# Patient Record
Sex: Male | Born: 1965 | Race: White | Hispanic: No | Marital: Married | Smoking: Never smoker
Health system: Southern US, Community
[De-identification: ages and names within clinical notes are randomized; demographics above are authoritative.]

## PROBLEM LIST (undated history)

## (undated) DIAGNOSIS — K8051 Calculus of bile duct without cholangitis or cholecystitis with obstruction: Principal | ICD-10-CM

## (undated) HISTORY — PX: VASECTOMY: SHX75

## (undated) HISTORY — DX: Calculus of bile duct without cholangitis or cholecystitis with obstruction: K80.51

---

## 2010-12-18 LAB — CBC AND DIFFERENTIAL
Hemoglobin: 14.9 g/dL (ref 13.5–17.5)
Platelets: 211 10*3/uL (ref 150–399)

## 2012-06-01 HISTORY — PX: GALLBLADDER SURGERY: SHX652

## 2013-03-21 ENCOUNTER — Encounter: Payer: Self-pay | Admitting: Family Medicine

## 2013-03-21 ENCOUNTER — Ambulatory Visit (INDEPENDENT_AMBULATORY_CARE_PROVIDER_SITE_OTHER): Payer: BC Managed Care – PPO | Admitting: Family Medicine

## 2013-03-21 VITALS — BP 111/74 | HR 65 | Ht 73.0 in | Wt 184.0 lb

## 2013-03-21 DIAGNOSIS — K8051 Calculus of bile duct without cholangitis or cholecystitis with obstruction: Secondary | ICD-10-CM

## 2013-03-21 DIAGNOSIS — Z9889 Other specified postprocedural states: Secondary | ICD-10-CM

## 2013-03-21 DIAGNOSIS — Z13220 Encounter for screening for lipoid disorders: Secondary | ICD-10-CM

## 2013-03-21 DIAGNOSIS — Z131 Encounter for screening for diabetes mellitus: Secondary | ICD-10-CM

## 2013-03-21 HISTORY — DX: Calculus of bile duct without cholangitis or cholecystitis with obstruction: K80.51

## 2013-03-21 NOTE — Progress Notes (Signed)
CC: Rodney Wright is a 47 y.o. male is here for Establish Care   Subjective: HPI:  Very pleasant 47 year old here to establish care  Patient has no acute complaints but informs me he had 90% of his pancreas removed in his gallbladder removed after a five-week stay in the hospital for what I presume is choledocholithiasis causing obstruction.  He reports he was cleared from his surgeon months ago and would like to establish care with out complaints. Over the past 3 months he is been able to gain weight and is back to eating a varied diet without restrictions. He occasionally has diarrhea but 99% of the time stools are daily well formed and painless. He denies any nausea vomiting or GI disturbance over the past 3 months.  No family history of pancreatic cancer. He denies polyuria polyphagia polydipsia nor vision loss. Denies any recent jaundice nor right upper quadrant pain.  Review of Systems - General ROS: negative for - chills, fever, night sweats, weight gain or weight loss Ophthalmic ROS: negative for - decreased vision Psychological ROS: negative for - anxiety or depression ENT ROS: negative for - hearing change, nasal congestion, tinnitus or allergies Hematological and Lymphatic ROS: negative for - bleeding problems, bruising or swollen lymph nodes Breast ROS: negative Respiratory ROS: no cough, shortness of breath, or wheezing Cardiovascular ROS: no chest pain or dyspnea on exertion Gastrointestinal ROS: no abdominal pain, change in bowel habits, or black or bloody stools Genito-Urinary ROS: negative for - genital discharge, genital ulcers, incontinence or abnormal bleeding from genitals Musculoskeletal ROS: negative for - joint pain or muscle pain Neurological ROS: negative for - headaches or memory loss Dermatological ROS: negative for lumps, mole changes, rash and skin lesion changes  Past Medical History  Diagnosis Date  . Choledocholithiasis with obstruction 03/21/2013    December  2013: 90% resection of pancreas.      History reviewed. No pertinent family history.   History  Substance Use Topics  . Smoking status: Never Smoker   . Smokeless tobacco: Not on file  . Alcohol Use: No     Objective: Filed Vitals:   03/21/13 1026  BP: 111/74  Pulse: 65    General: Alert and Oriented, No Acute Distress HEENT: Pupils equal, round, reactive to light. Conjunctivae clear.  External ears unremarkable, canals clear with intact TMs with appropriate landmarks.  Middle ear appears open without effusion. Pink inferior turbinates.  Moist mucous membranes, pharynx without inflammation nor lesions.  Neck supple without palpable lymphadenopathy nor abnormal masses. Lungs: Clear to auscultation bilaterally, no wheezing/ronchi/rales.  Comfortable work of breathing. Good air movement. Cardiac: Regular rate and rhythm. Normal S1/S2.  No murmurs, rubs, nor gallops.   Abdomen: Normal bowel sounds, soft and non tender without palpable masses. Extremities: No peripheral edema.  Strong peripheral pulses.  Mental Status: No depression, anxiety, nor agitation. Skin: Warm and dry.  Assessment & Plan: Rodney Wright was seen today for establish care.  Diagnoses and associated orders for this visit:  Choledocholithiasis with obstruction - Lipid panel - COMPLETE METABOLIC PANEL WITH GFR - Lipase  Lipid screening - Lipid panel  History of pancreatic surgery - COMPLETE METABOLIC PANEL WITH GFR - Lipase  Diabetes mellitus screening - COMPLETE METABOLIC PANEL WITH GFR     choledocholithiasis with obstruction: Stable and controlled without signs of pancreatic insufficiency. We'll obtain baseline fasting sugar also to rule out type 2 diabetes, complete metabolic panel to assess for liver function and bilirubin. Obtaining baseline lipase to ensure no subclinical  pancreatitis. If all labs are normal he may consider return on an annual basis sooner if needed  30 minutes spent face-to-face  during visit today of which at least 50% was counseling or coordinating care regarding choledocholithiasis with obstruction.   Return in about 1 year (around 03/21/2014).

## 2013-03-29 LAB — LIPID PANEL
HDL: 41 mg/dL (ref >39)
Total CHOL/HDL Ratio: 3.5 Ratio
Triglycerides: 91 mg/dL (ref <150)

## 2013-03-29 LAB — COMPLETE METABOLIC PANEL WITH GFR
Alkaline Phosphatase: 74 U/L (ref 39–117)
BUN: 21 mg/dL (ref 6–23)
CO2: 28 mEq/L (ref 19–32)
Creat: 0.89 mg/dL (ref 0.50–1.35)
GFR, Est African American: >89 mL/min
GFR, Est Non African American: >89 mL/min
Glucose, Bld: 90 mg/dL (ref 70–99)
Total Bilirubin: 1 mg/dL (ref 0.3–1.2)

## 2013-03-29 LAB — LIPASE: Lipase: 53 U/L (ref 0–75)

## 2013-04-03 ENCOUNTER — Encounter: Payer: Self-pay | Admitting: Family Medicine

## 2013-04-03 ENCOUNTER — Encounter: Payer: Self-pay | Admitting: *Deleted

## 2013-04-03 DIAGNOSIS — K8591 Acute pancreatitis with uninfected necrosis, unspecified: Secondary | ICD-10-CM | POA: Insufficient documentation

## 2013-04-03 DIAGNOSIS — Z800 Family history of malignant neoplasm of digestive organs: Secondary | ICD-10-CM | POA: Insufficient documentation

## 2013-09-06 ENCOUNTER — Other Ambulatory Visit: Payer: Self-pay

## 2014-12-09 ENCOUNTER — Ambulatory Visit (INDEPENDENT_AMBULATORY_CARE_PROVIDER_SITE_OTHER): Payer: No Typology Code available for payment source | Admitting: Family Medicine

## 2014-12-09 ENCOUNTER — Encounter: Payer: Self-pay | Admitting: Family Medicine

## 2014-12-09 VITALS — BP 142/75 | HR 82 | Wt 213.0 lb

## 2014-12-09 DIAGNOSIS — R112 Nausea with vomiting, unspecified: Secondary | ICD-10-CM

## 2014-12-09 DIAGNOSIS — R413 Other amnesia: Secondary | ICD-10-CM

## 2014-12-09 LAB — COMPLETE METABOLIC PANEL WITH GFR
ALT: 15 U/L (ref 0–53)
AST: 16 U/L (ref 0–37)
Albumin: 3.9 g/dL (ref 3.5–5.2)
Alkaline Phosphatase: 71 U/L (ref 39–117)
BUN: 14 mg/dL (ref 6–23)
CO2: 25 meq/L (ref 19–32)
Calcium: 8.6 mg/dL (ref 8.4–10.5)
Chloride: 104 mEq/L (ref 96–112)
Creat: 1.01 mg/dL (ref 0.50–1.35)
GFR, EST NON AFRICAN AMERICAN: 86 mL/min
GFR, Est African American: >89 mL/min
Glucose, Bld: 128 mg/dL — ABNORMAL HIGH (ref 70–99)
Potassium: 4 mEq/L (ref 3.5–5.3)
SODIUM: 138 meq/L (ref 135–145)
Total Bilirubin: 0.9 mg/dL (ref 0.2–1.2)
Total Protein: 6.6 g/dL (ref 6.0–8.3)

## 2014-12-09 LAB — CBC
HEMATOCRIT: 41.2 % (ref 39.0–52.0)
HEMOGLOBIN: 13.8 g/dL (ref 13.0–17.0)
MCH: 27.5 pg (ref 26.0–34.0)
MCHC: 33.5 g/dL (ref 30.0–36.0)
MCV: 82.2 fL (ref 78.0–100.0)
MPV: 9.3 fL (ref 8.6–12.4)
PLATELETS: 181 10*3/uL (ref 150–400)
RBC: 5.01 MIL/uL (ref 4.22–5.81)
RDW: 13.8 % (ref 11.5–15.5)
WBC: 6.9 10*3/uL (ref 4.0–10.5)

## 2014-12-09 LAB — LIPASE: LIPASE: 1694 U/L — AB (ref 0–75)

## 2014-12-09 NOTE — Progress Notes (Addendum)
CC: Rodney EwingsDaniel Wright is a 49 y.o. male is here for nausea x 1 week   Subjective: HPI:  Complains of one week of nausea that only occurs from 10 PM-1 AM in the morning. For 7 days this has been occurring and his joined with vomiting, once the vomiting occurs he is completely back to his regular state of health without any nausea. His been getting slightly better since the onset. During the day he feels completely fine. There's been no decreased appetite, change in diet, diarrhea, constipation, fevers chills nor unintentional weight loss. He has had some left upper quadrant pain described as a sharpness that's nonradiating that can occur with any activity that occurs maybe once or twice a month. This is been present ever since his pancreas and gallbladder was removed.  He denies any other abdominal pain or back pain. Review of systems is positive for nonproductive cough.symptoms above are currently mild in severity. Nothing particularly makes it better or worse.   3 years ago he's felt like he can't process information is fast. He is unable to provide any details beyond this. His wife has noticed it as well. He denies difficulty with short-term or long-term memory. Denies getting lost in familiar areas.  Review Of Systems Outlined In HPI  Past Medical History  Diagnosis Date  . Choledocholithiasis with obstruction 03/21/2013    December 2013: 90% resection of pancreas.     Past Surgical History  Procedure Laterality Date  . Gallbladder surgery  06/2012  . Vasectomy     Family History  Problem Relation Age of Onset  . Colon cancer Father [id number]    1988  . Hypertension Father   . Hyperlipidemia Father   . Lung cancer Paternal Grandmother     History   Social History  . Marital Status: Married    Spouse Name: N/A    Number of Children: N/A  . Years of Education: N/A   Occupational History  . Not on file.   Social History Main Topics  . Smoking status: Never Smoker   . Smokeless tobacco:  Not on file  . Alcohol Use: No  . Drug Use: No  . Sexual Activity: Yes   Other Topics Concern  . Not on file   Social History Narrative     Objective: BP 142/75 mmHg  Pulse 82  Wt 213 lb (96.616 kg)  General: Alert and Oriented, No Acute Distress HEENT: Pupils equal, round, reactive to light. Conjunctivae clear.  External ears unremarkable, canals clear with intact TMs with appropriate landmarks.  Middle ear appears open without effusion. Pink inferior turbinates.  Moist mucous membranes, pharynx without inflammation nor lesions.  Neck supple without palpable lymphadenopathy nor abnormal masses. Lungs: Clear to auscultation bilaterally, no wheezing/ronchi/rales.  Comfortable work of breathing. Good air movement. Cardiac: Regular rate and rhythm. Normal S1/S2.  No murmurs, rubs, nor gallops.   Abdomen: Normal bowel sounds, soft and non tender without palpable masses. Bowel sounds heard underneath the sternum. Extremities: No peripheral edema.  Strong peripheral pulses.  Mental Status: No depression, anxiety, nor agitation. Skin: Warm and dry.  Assessment & Plan: Rodney Wright was seen today for nausea x 1 week.  Diagnoses and associated orders for this visit:  Non-intractable vomiting with nausea, vomiting of unspecified type - Lipase - COMPLETE METABOLIC PANEL WITH GFR - CBC  Memory loss    Nausea and vomiting: Ruling out pancreatitis, hepatitis and screening for bacterial infection with white count. If the above is normal I prepared  him that his symptoms could also be attributed to viral gastroenteritis and/or hiatal hernia. He denies nausea medication today. Memory loss: Mini-Mental state exam was performed today he scored 30 out of 30, reassurance was provided that his subjective memory loss was not significant enough to warrant further testing nor is it worrisome for progressive cognitive decline.   Return if symptoms worsen or fail to improve.

## 2014-12-10 ENCOUNTER — Telehealth: Payer: Self-pay | Admitting: Family Medicine

## 2014-12-10 DIAGNOSIS — K8500 Idiopathic acute pancreatitis without necrosis or infection: Secondary | ICD-10-CM

## 2014-12-10 NOTE — Telephone Encounter (Signed)
Sue Lushndrea, Will you please let patient know that his lipase level was significantly elevated reflecting pancreatic inflammation.  I would strongly recommend that he meet with a gastroenterologist for further workup and management, I've placed a referral for this today, I'll ask that it be with [facility] since he's been seen by them before.  Fortunately liver function was normal.

## 2014-12-10 NOTE — Telephone Encounter (Signed)
Left message on home # to call back in re labs, ( voice on home # was an unidentified # so I didn't leave a message) message left on cell # with results

## 2014-12-12 ENCOUNTER — Encounter: Payer: Self-pay | Admitting: Family Medicine

## 2014-12-12 DIAGNOSIS — R748 Abnormal levels of other serum enzymes: Secondary | ICD-10-CM | POA: Insufficient documentation

## 2014-12-13 LAB — LIPASE: Lipase: 383

## 2014-12-31 ENCOUNTER — Telehealth: Payer: Self-pay

## 2014-12-31 DIAGNOSIS — R509 Fever, unspecified: Secondary | ICD-10-CM

## 2014-12-31 MED ORDER — OSELTAMIVIR PHOSPHATE 75 MG PO CAPS: 75.0000 mg | ORAL_CAPSULE | Freq: Two times a day (BID) | ORAL | Status: DC

## 2014-12-31 NOTE — Telephone Encounter (Signed)
tamiflu has been sent to his [facility], start immediately and come in during my next availablilty to make sure it's nothing more serious than the flu

## 2014-12-31 NOTE — Telephone Encounter (Signed)
Left message on vm

## 2014-12-31 NOTE — Telephone Encounter (Signed)
Patient's wife called and states; He has been sick for the last 30 hours with fever, chills, headache and body aches. His fever has been up to 102.7. He has taken DayQuil with a little relief. She is concerned he may have the flu. No appointment available today.

## 2015-01-01 ENCOUNTER — Encounter: Payer: Self-pay | Admitting: Family Medicine

## 2015-01-01 ENCOUNTER — Ambulatory Visit (INDEPENDENT_AMBULATORY_CARE_PROVIDER_SITE_OTHER): Payer: No Typology Code available for payment source | Admitting: Family Medicine

## 2015-01-01 VITALS — BP 135/72 | HR 91 | Temp 98.1°F | Wt 206.0 lb

## 2015-01-01 DIAGNOSIS — J111 Influenza due to unidentified influenza virus with other respiratory manifestations: Secondary | ICD-10-CM

## 2015-01-01 NOTE — Progress Notes (Signed)
CC: Rodney EwingsDaniel Wright is a 49 y.o. male is here for flu?   Subjective: HPI:  Complaints of fever maximum temperature 102.7, chills, nausea and 2 episodes of vomiting since Sunday of this last weekend. Symptoms came on abruptly and seemed to have peaked as of yesterday. He was able to tolerate a smoothie yesterday after he got his appetite back. He has not had a temperature today but still takes acetaminophen every 6 hours. He reports fatigue was extreme up to yesterday and today has been more tolerable only moderate in severity. He's had some generalized abdominal discomfort but it is mostly absent today. Symptoms described above were not at all amplified after consuming his smoothie or while drinking Gatorade.  Other than above nothing has influenced his symptoms. He reports that urine is still pale yellow and he continues to have regular bowel movements without diarrhea. No back pain chest pain shortness of breath confusion, sore throat, rash, photophobia nor confusion. He had a headache but he describes it as dull and annoying but even this is improving.   Review Of Systems Outlined In HPI  Past Medical History  Diagnosis Date  . Choledocholithiasis with obstruction 03/21/2013    December 2013: 90% resection of pancreas.     Past Surgical History  Procedure Laterality Date  . Gallbladder surgery  06/2012  . Vasectomy     Family History  Problem Relation Age of Onset  . Colon cancer Father [id number]    1988  . Hypertension Father   . Hyperlipidemia Father   . Lung cancer Paternal Grandmother     History   Social History  . Marital Status: Married    Spouse Name: N/A  . Number of Children: N/A  . Years of Education: N/A   Occupational History  . Not on file.   Social History Main Topics  . Smoking status: Never Smoker   . Smokeless tobacco: Not on file  . Alcohol Use: No  . Drug Use: No  . Sexual Activity: Yes   Other Topics Concern  . Not on file   Social History Narrative      Objective: BP 135/72 mmHg  Pulse 91  Temp(Src) 98.1 F (36.7 C) (Oral)  Wt 206 lb (93.441 kg)  SpO2 96%  General: Alert and Oriented, No Acute Distress but appears mildly fatigued HEENT: Pupils equal, round, reactive to light. Conjunctivae clear.  External ears unremarkable, canals clear with intact TMs with appropriate landmarks.  Middle ear appears open without effusion. Pink inferior turbinates.  Moist mucous membranes, pharynx without inflammation nor lesions.  Neck supple without palpable lymphadenopathy nor abnormal masses. Lungs: Clear to auscultation bilaterally, no wheezing/ronchi/rales.  Comfortable work of breathing. Good air movement. Cardiac: Regular rate and rhythm. Normal S1/S2.  No murmurs, rubs, nor gallops.   Abdomen: Normal bowel sounds, soft and non tender without palpable masses. Extremities: No peripheral edema.  Strong peripheral pulses.  Mental Status: No depression, anxiety, nor agitation. Skin: Warm and dry.  Assessment & Plan: Rodney Wright was seen today for flu?.  Diagnoses and all orders for this visit:  Influenza   Clinical suspicion for influenza-like illnesses extremely high. Given his history of necrotizing pancreatitis he and his wife are understandably concerned that this could be another episode of pancreatitis. Joint decision to watch and wait his condition given improving symptoms and not many symptoms that are specific to pancreatitis or necrotizing pancreatitis. Offered blood work today but joint decision that this is not necessary. He'll continue to keep focused  on trying to stay well-hydrated and use acetaminophen to stay comfortable. Lots of rest. He declines any anti-emetics. Should he have any worsening symptoms have asked him to call me immediately so we can order a CT scan of the abdomen and pelvis with contrast.  25 minutes spent face-to-face during visit today of which at least 50% was counseling or coordinating care regarding: 1. Influenza        Return if symptoms worsen or fail to improve.

## 2015-01-06 ENCOUNTER — Ambulatory Visit (INDEPENDENT_AMBULATORY_CARE_PROVIDER_SITE_OTHER): Payer: No Typology Code available for payment source

## 2015-01-06 ENCOUNTER — Telehealth: Payer: Self-pay | Admitting: Family Medicine

## 2015-01-06 ENCOUNTER — Telehealth: Payer: Self-pay

## 2015-01-06 ENCOUNTER — Encounter: Payer: Self-pay | Admitting: Family Medicine

## 2015-01-06 DIAGNOSIS — R5081 Fever presenting with conditions classified elsewhere: Secondary | ICD-10-CM

## 2015-01-06 DIAGNOSIS — R1909 Other intra-abdominal and pelvic swelling, mass and lump: Secondary | ICD-10-CM

## 2015-01-06 DIAGNOSIS — J9811 Atelectasis: Secondary | ICD-10-CM

## 2015-01-06 DIAGNOSIS — K863 Pseudocyst of pancreas: Secondary | ICD-10-CM | POA: Insufficient documentation

## 2015-01-06 DIAGNOSIS — J90 Pleural effusion, not elsewhere classified: Secondary | ICD-10-CM

## 2015-01-06 DIAGNOSIS — R1010 Upper abdominal pain, unspecified: Secondary | ICD-10-CM

## 2015-01-06 LAB — CBC WITH DIFFERENTIAL/PLATELET
Basophils Absolute: 0 10*3/uL (ref 0.0–0.1)
Basophils Relative: 0 % (ref 0–1)
EOS ABS: 0 10*3/uL (ref 0.0–0.7)
EOS PCT: 0 % (ref 0–5)
HCT: 37.7 % — ABNORMAL LOW (ref 39.0–52.0)
HEMOGLOBIN: 12.1 g/dL — AB (ref 13.0–17.0)
LYMPHS ABS: 0.5 10*3/uL — AB (ref 0.7–4.0)
Lymphocytes Relative: 4 % — ABNORMAL LOW (ref 12–46)
MCH: 25.4 pg — ABNORMAL LOW (ref 26.0–34.0)
MCHC: 32.2 g/dL (ref 30.0–36.0)
MCV: 79.2 fL (ref 78.0–100.0)
Monocytes Absolute: 1.4 10*3/uL — ABNORMAL HIGH (ref 0.1–1.0)
Monocytes Relative: 11 % (ref 3–12)
NEUTROS PCT: 85 % — AB (ref 43–77)
Neutro Abs: 10.7 10*3/uL — ABNORMAL HIGH (ref 1.7–7.7)
Platelets: 307 10*3/uL (ref 150–400)
RBC: 4.76 MIL/uL (ref 4.22–5.81)
RDW: 15.1 % (ref 11.5–15.5)
WBC: 12.6 10*3/uL — ABNORMAL HIGH (ref 4.0–10.5)

## 2015-01-06 LAB — COMPLETE METABOLIC PANEL WITH GFR
ALBUMIN: 3.4 g/dL — AB (ref 3.5–5.2)
ALT: 44 U/L (ref 0–53)
AST: 57 U/L — ABNORMAL HIGH (ref 0–37)
Alkaline Phosphatase: 94 U/L (ref 39–117)
BUN: 12 mg/dL (ref 6–23)
CALCIUM: 8.3 mg/dL — AB (ref 8.4–10.5)
CO2: 27 meq/L (ref 19–32)
CREATININE: 0.94 mg/dL (ref 0.50–1.35)
Chloride: 90 mEq/L — ABNORMAL LOW (ref 96–112)
GFR, Est Non African American: >89 mL/min
GLUCOSE: 150 mg/dL — AB (ref 70–99)
Potassium: 3.2 mEq/L — ABNORMAL LOW (ref 3.5–5.3)
SODIUM: 134 meq/L — AB (ref 135–145)
TOTAL PROTEIN: 6.5 g/dL (ref 6.0–8.3)
Total Bilirubin: 0.6 mg/dL (ref 0.2–1.2)

## 2015-01-06 LAB — LIPASE: LIPASE: 101 U/L — AB (ref 0–75)

## 2015-01-06 MED ORDER — CIPROFLOXACIN HCL 500 MG PO TABS: 500.0000 mg | ORAL_TABLET | Freq: Two times a day (BID) | ORAL | Status: DC

## 2015-01-06 MED ORDER — METRONIDAZOLE 500 MG PO TABS: 500.0000 mg | ORAL_TABLET | Freq: Three times a day (TID) | ORAL | Status: DC

## 2015-01-06 MED ORDER — IOHEXOL 300 MG/ML  SOLN
100.0000 mL | Freq: Once | INTRAMUSCULAR | Status: AC | PRN
  Administered 2015-01-06: 100 mL via INTRAVENOUS

## 2015-01-06 NOTE — Telephone Encounter (Signed)
No PA required for CT abdomen and pelvis with contrast per [facility] notified.

## 2015-01-06 NOTE — Telephone Encounter (Signed)
Sue Lushndrea, Will you please let patient know that his CT scan showed that he's growing a cyst around the pancreas.  This could be representative of an infection that's causing his discomfort and what I thought was the flu last week.  His blood work is not back yet but I'd recommend he see a Development worker, international aidgeneral surgeon for their opinion.  I'll place a urgent referral now.  Also I'd recommend he start taking ciprofloxacin and metronidazole antibiotics that [facility]. I would also ask him to notify Dr. Caryl NeverJue (his gastroenterologist) about this ASAP.

## 2015-01-06 NOTE — Telephone Encounter (Signed)
Will you please let patient know that the general surgery office we tried to refer him to said they had nothing to offer him.  I suspect this cyst needs to be drained and I'm surprised they weren't willing to help.  I'd strongly recommend he see Dr. Caryl NeverJue or one of his colleagues ASAP. Since he's already established with them all it probably requires is for him to all their office, I'm sure one of our referral clerks would be able to help out if he's having difficulty with access.  We should have labs back tomorrow.

## 2015-01-06 NOTE — Telephone Encounter (Signed)
General surgeon office said they did not have any urgent appt for this pt because a cyst in the pancrease would not be taken care of in the office and is not considered urgent. She states if he is having fever and chills, ect then she said he should go to the ED.  Pt was notified about your reccomendations

## 2015-01-06 NOTE — Telephone Encounter (Signed)
Left message on vm

## 2015-01-06 NOTE — Telephone Encounter (Signed)
Rodney Wright complains he still has fever, chills and abdominal pain off and on. Dr Hommel's recommendation was to get a CT abdomen and pelvis with contrast and lab work - CBC diff, CMP Lipase. Patient is aware and is on his way. Labs have been faxed to the lab for stat blood draw.

## 2015-01-07 ENCOUNTER — Telehealth: Payer: Self-pay | Admitting: Family Medicine

## 2015-01-07 NOTE — Telephone Encounter (Signed)
Called patient (both numbers)  to get updates on his condition and go over lab work.  No answer, left message, will continue attempts to contact him.

## 2015-01-07 NOTE — Telephone Encounter (Signed)
Again, left message on cell phone.

## 2015-01-07 NOTE — Telephone Encounter (Signed)
Attempted cell phone, no answer.

## 2015-01-07 NOTE — Telephone Encounter (Signed)
Contacted patient, fever free for 24 hours now after starting antibiotics, met with his old surgeon today and will have drain placed tomorrow as an outpatient.  Reviewed labs, very little caloric intake over the last week, discussed foods/soups to help replete electrolytes. 

## 2015-01-14 ENCOUNTER — Encounter: Payer: Self-pay | Admitting: Family Medicine

## 2015-01-20 ENCOUNTER — Telehealth: Payer: Self-pay | Admitting: Family Medicine

## 2015-01-20 ENCOUNTER — Other Ambulatory Visit: Payer: Self-pay | Admitting: Surgery

## 2015-01-20 DIAGNOSIS — K863 Pseudocyst of pancreas: Secondary | ICD-10-CM

## 2015-01-20 NOTE — Telephone Encounter (Signed)
Patient did not show up for appt at [facility] today.  They wanted to make you aware.

## 2015-01-22 ENCOUNTER — Ambulatory Visit (INDEPENDENT_AMBULATORY_CARE_PROVIDER_SITE_OTHER): Payer: No Typology Code available for payment source

## 2015-01-22 DIAGNOSIS — K863 Pseudocyst of pancreas: Secondary | ICD-10-CM | POA: Diagnosis not present

## 2015-01-22 MED ORDER — IOHEXOL 300 MG/ML  SOLN: 100.0000 mL | Freq: Once | INTRAMUSCULAR | Status: AC | PRN

## 2015-09-10 ENCOUNTER — Encounter: Payer: Self-pay | Admitting: Family Medicine

## 2015-09-10 ENCOUNTER — Ambulatory Visit (INDEPENDENT_AMBULATORY_CARE_PROVIDER_SITE_OTHER): Payer: 59 | Admitting: Family Medicine

## 2015-09-10 ENCOUNTER — Ambulatory Visit (INDEPENDENT_AMBULATORY_CARE_PROVIDER_SITE_OTHER): Payer: 59

## 2015-09-10 VITALS — BP 125/73 | HR 56 | Wt 217.0 lb

## 2015-09-10 DIAGNOSIS — M7042 Prepatellar bursitis, left knee: Secondary | ICD-10-CM | POA: Diagnosis not present

## 2015-09-10 DIAGNOSIS — Z1389 Encounter for screening for other disorder: Secondary | ICD-10-CM | POA: Diagnosis not present

## 2015-09-10 DIAGNOSIS — M25562 Pain in left knee: Secondary | ICD-10-CM | POA: Diagnosis not present

## 2015-09-10 DIAGNOSIS — G8929 Other chronic pain: Secondary | ICD-10-CM

## 2015-09-10 MED ORDER — DICLOFENAC SODIUM 1 % TD GEL: 4.0000 g | Freq: Four times a day (QID) | TRANSDERMAL | Status: DC

## 2015-09-10 MED ORDER — NAPROXEN 500 MG PO TABS: 500.0000 mg | ORAL_TABLET | Freq: Two times a day (BID) | ORAL | Status: DC

## 2015-09-10 NOTE — Progress Notes (Signed)
Rodney EwingsDaniel Wright is a 49 y.o. male who [facility]  today for left knee pain. Patient was in his normal state of health a week and a half ago. He fell and landed on his left knee in the parking lot. He notes pain in the anterior portion of his knee and on the lateral portion of his knee since. Pain is worse with resisted knee extension. He's tried some over-the-counter medicines helped a bit. No fevers chills nausea vomiting or diarrhea. No locking catching or giving way. He denies any significant swelling.   Past Medical History  Diagnosis Date  . Choledocholithiasis with obstruction 03/21/2013    December 2013: 90% resection of pancreas.    Past Surgical History  Procedure Laterality Date  . Gallbladder surgery  06/2012  . Vasectomy     Social History  Substance Use Topics  . Smoking status: Never Smoker   . Smokeless tobacco: Not on file  . Alcohol Use: No   family history includes Colon cancer (age of onset: 548) in his father; Hyperlipidemia in his father; Hypertension in his father; Lung cancer in his paternal grandmother.  ROS as above Medications: Current Outpatient Prescriptions  Medication Sig Dispense Refill  . diclofenac sodium (VOLTAREN) 1 % GEL Apply 4 g topically 4 (four) times daily. 100 g 12  . naproxen (NAPROSYN) 500 MG tablet Take 1 tablet (500 mg total) by mouth 2 (two) times daily with a meal. 28 tablet 0   No current facility-administered medications for this visit.   No Known Allergies   Exam:  BP 125/73 mmHg  Pulse 56  Wt 217 lb (98.431 kg) Gen: Well NAD  Exts: Brisk capillary refill, warm and well perfused.  Left knee: Slight ecchymosis present just inferior to the patellar tendon insertion onto the tibia. He has a palpable squeak overlying the patellar tendon. The patellar tendon itself is tender to palpation. The lateral joint line is mildly tender to palpation as well. Negative medial and lateral  McMurray's tests. Negative valgus and varus stress. Negative anterior and posterior drawer test. Knee Range of motion is normal. Normal gait   No results found for this or any previous visit (from the past 24 hour(s)). Dg Knee 1-2 Views Right  09/10/2015  CLINICAL DATA:  Chronic left knee pain. No reported injury. Initial evaluation. EXAM: RIGHT KNEE - 1-2 VIEW COMPARISON:  No prior exams available for comparison. FINDINGS: No acute bony or joint abnormality identified. Mild medial compartment degenerative change. IMPRESSION: No acute abnormality.  Mild medial compartment degenerative change. 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 Otherwise the knee x-rays normal without fracture. Awaiting formal radiology read.  Please see individual assessment and plan sections.

## 2015-09-10 NOTE — Progress Notes (Signed)
Quick Note:  X-rays show no fractures. You do have evidence of old Osgood-Schlatter's disease this likely not causing your current pain. ______

## 2015-09-10 NOTE — Assessment & Plan Note (Signed)
Symptoms are most consistent with traumatic prepatellar bursitis of the left knee. He may also have a possible lateral meniscus tear based on joint line tenderness. Plan for relative rest topical diclofenac and oral naproxen. Recheck in a few weeks. I recommended eccentric patellar tendon exercises.

## 2015-09-10 NOTE — Patient Instructions (Signed)
Thank you for coming in today. Get x-rays today. My staff will call with results. Use ice as needed. Apply diclofenac gel and use oral naproxen as needed for pain. Take the pills with Zantac Pepcid or Prilosec to prevent stomach ulceration. Return in a few weeks if not improving.  Prepatellar Bursitis With Rehab  Bursitis is a condition that is characterized by inflammation of a bursa. Kateri Mc exists in many areas of the body. They are fluid-filled sacs that lie between a soft tissue (skin, tendon, or ligament) and a bone, and they reduce friction between the structures as well as the stress placed on the soft tissue. Prepatellar bursitis is inflammation of the bursa that lies between the skin and the kneecap (patella). This condition often causes pain over the patella. SYMPTOMS   Pain, tenderness, and/or inflammation over the patella.  Pain that worsens with movement of the knee joint.  Decreased range of motion for the knee joint.  A crackling sound (crepitation) when the bursa is moved or touched.  Occasionally, painless swelling of the bursa.  Fever (when infected). CAUSES  Bursitis is caused by damage to the bursa, which results in an inflammatory response. Common mechanisms of injury include:  Direct trauma to the front of the knee.  Repetitive and/or stressful use of the knee. RISK INCREASES WITH:  Activities in which kneeling and/or falling on one's knees is likely (volleyball or football).  Repetitive and stressful training, especially if it involves running on hills.  Improper training techniques, such as a sudden increase in the intensity, frequency, or duration of training.  Failure to warm up properly before activity.  Poor technique.  Artificial turf. PREVENTION   Avoid kneeling or falling on your knees.  Warm up and stretch properly before activity.  Allow for adequate recovery between workouts.  Maintain physical fitness:  Strength, flexibility, and  endurance.  Cardiovascular fitness.  Learn and use proper technique. When possible, have a coach correct improper technique.  Wear properly fitted and padded protective equipment (knee pads). PROGNOSIS  If treated properly, then the symptoms of prepatellar bursitis usually resolve within 2 weeks. RELATED COMPLICATIONS   Recurrent symptoms that result in a chronic problem.  Prolonged healing time, if improperly treated or reinjured.  Limited range of motion.  Infection of bursa.  Chronic inflammation or scarring of bursa. TREATMENT  Treatment initially involves the use of ice and medication to help reduce pain and inflammation. The use of strengthening and stretching exercises may help reduce pain with activity, especially those of the quadriceps and hamstring muscles. These exercises may be performed at home or with referral to a therapist. Your caregiver may recommend knee pads when you return to playing sports, in order to reduce the stress on the prepatellar bursa. If symptoms persist despite treatment, then your caregiver may drain fluid out with a needle (aspirate) the bursa. If symptoms persist for greater than 6 months despite nonsurgical (conservative) treatment, then surgery may be recommended to remove the bursa.  MEDICATION  If pain medication is necessary, then nonsteroidal anti-inflammatory medications, such as aspirin and ibuprofen, or other minor pain relievers, such as acetaminophen, are often recommended.  Do not take pain medication for 7 days before surgery.  Prescription pain relievers may be given if deemed necessary by your caregiver. Use only as directed and only as much as you need.  Corticosteroid injections may be given by your caregiver. These injections should be reserved for the most serious cases, because they may only be given  a certain number of times. HEAT AND COLD  Cold treatment (icing) relieves pain and reduces inflammation. Cold treatment should  be applied for 10 to 15 minutes every 2 to 3 hours for inflammation and pain and immediately after any activity that aggravates your symptoms. Use ice packs or massage the area with a piece of ice (ice massage).  Heat treatment may be used prior to performing the stretching and strengthening activities prescribed by your caregiver, physical therapist, or athletic trainer. Use a heat pack or soak the injury in warm water. SEEK MEDICAL CARE IF:  Treatment seems to offer no benefit, or the condition worsens.  Any medications produce adverse side effects. EXERCISES RANGE OF MOTION (ROM) AND STRETCHING EXERCISES - Prepatellar Bursitis These exercises may help you when beginning to rehabilitate your injury. Your symptoms may resolve with or without further involvement from your physician, physical therapist or athletic trainer. While completing these exercises, remember:   Restoring tissue flexibility helps normal motion to return to the joints. This allows healthier, less painful movement and activity.  An effective stretch should be held for at least 30 seconds.  A stretch should never be painful. You should only feel a gentle lengthening or release in the stretched tissue. STRETCH - Hamstrings, Standing  Stand or sit and extend your right / left leg, placing your foot on a chair or foot stool  Keeping a slight arch in your low back and your hips straight forward.  Lead with your chest and lean forward at the waist until you feel a gentle stretch in the back of your right / left knee or thigh. (When done correctly, this exercise requires leaning only a small distance.)  Hold this position for __________ seconds. Repeat __________ times. Complete this stretch __________ times per day. STRETCH - Quadriceps, Prone   Lie on your stomach on a firm surface, such as a bed or padded floor.  Bend your right / left knee and grasp your ankle. If you are unable to reach, your ankle or pant leg, use a  belt around your foot to lengthen your reach.  Gently pull your heel toward your buttocks. Your knee should not slide out to the side. You should feel a stretch in the front of your thigh and/or knee.  Hold this position for __________ seconds. Repeat __________ times. Complete this stretch __________ times per day.  STRETCH - Hamstrings/Adductors, V-Sit   Sit on the floor with your legs extended in a large "V," keeping your knees straight.  With your head and chest upright, bend at your waist reaching for your right foot to stretch your left adductors.  You should feel a stretch in your left inner thigh. Hold for __________ seconds.  Return to the upright position to relax your leg muscles.  Continuing to keep your chest upright, bend straight forward at your waist to stretch your hamstrings.  You should feel a stretch behind both of your thighs and/or knees. Hold for __________ seconds.  Return to the upright position to relax your leg muscles.  Repeat steps 2 through 4. Repeat __________ times. Complete this exercise __________ times per day.  STRENGTHENING EXERCISES - Prepatellar Bursitis  These exercises may help you when beginning to rehabilitate your injury. They may resolve your symptoms with or without further involvement from your physician, physical therapist or athletic trainer. While completing these exercises, remember:  Muscles can gain both the endurance and the strength needed for everyday activities through controlled exercises.  Complete these  exercises as instructed by your physician, physical therapist or athletic trainer. Progress the resistance and repetitions only as guided. STRENGTH - Quadriceps, Isometrics  Lie on your back with your right / left leg extended and your opposite knee bent.  Gradually tense the muscles in the front of your right / left thigh. You should see either your kneecap slide up toward your hip or increased dimpling just above the  knee. This motion will push the back of the knee down toward the floor/mat/bed on which you are lying.  Hold the muscle as tight as you can without increasing your pain for __________ seconds.  Relax the muscles slowly and completely in between each repetition. Repeat __________ times. Complete this exercise __________ times per day.  STRENGTH - Quadriceps, Short Arcs   Lie on your back. Place a __________ inch towel roll under your knee so that the knee slightly bends.  Raise only your lower leg by tightening the muscles in the front of your thigh. Do not allow your thigh to rise.  Hold this position for __________ seconds. Repeat __________ times. Complete this exercise __________ times per day.  OPTIONAL ANKLE WEIGHTS: Begin with ____________________, but DO NOT exceed ____________________. Increase in1 lb/0.5 kg increments.  STRENGTH - Quadriceps, Straight Leg Raises  Quality counts! Watch for signs that the quadriceps muscle is working to insure you are strengthening the correct muscles and not "cheating" by substituting with healthier muscles.  Lay on your back with your right / left leg extended and your opposite knee bent.  Tense the muscles in the front of your right / left thigh. You should see either your kneecap slide up or increased dimpling just above the knee. Your thigh may even quiver.  Tighten these muscles even more and raise your leg 4 to 6 inches off the floor. Hold for __________ seconds.  Keeping these muscles tense, lower your leg.  Relax the muscles slowly and completely in between each repetition. Repeat __________ times. Complete this exercise __________ times per day.  STRENGTH - Quadriceps, Step-Ups   Use a thick book, step or step stool that is __________ inches tall.  Holding a wall or counter for balance only, not support.  Slowly step-up with your right / left foot, keeping your knee in line with your hip and foot. Do not allow your knee to bend so  far that you cannot see your toes.  Slowly unlock your knee and lower yourself to the starting position. Your muscles, not gravity, should lower you. Repeat __________ times. Complete this exercise __________ times per day.   This information is not intended to replace advice given to you by your health care provider. Make sure you discuss any questions you have with your health care provider.   Document Released: 10/18/2005 Document Revised: 07/09/2015 Document Reviewed: 01/30/2009 Elsevier Interactive Patient Education [employer]2016 [employer] Inc.

## 2015-12-17 ENCOUNTER — Encounter: Payer: Self-pay | Admitting: Family Medicine

## 2015-12-17 ENCOUNTER — Ambulatory Visit (INDEPENDENT_AMBULATORY_CARE_PROVIDER_SITE_OTHER): Payer: 59 | Admitting: Family Medicine

## 2015-12-17 VITALS — BP 120/74 | HR 69 | Wt 218.0 lb

## 2015-12-17 DIAGNOSIS — R197 Diarrhea, unspecified: Secondary | ICD-10-CM

## 2015-12-17 MED ORDER — DIPHENOXYLATE-ATROPINE 2.5-0.025 MG PO TABS: 1.0000 | ORAL_TABLET | Freq: Four times a day (QID) | ORAL | Status: DC | PRN

## 2015-12-17 NOTE — Progress Notes (Signed)
CC: Rodney Wright is a 50 y.o. male is here for Diarrhea   Subjective: HPI:  Ever since Monday evening he's been experiencing loose stool described as diarrhea. It's further described as liquid with mushy bits of feces.is having a dedicated to 6 times a day. It's also waking him up early in the morning to have a bowel movement. He denies any bowel discomfort. He decreased his solids intake but only because he notices it more he has diarrhea. He denies any nausea or back pain. He denies any fevers, chills or flank pain. No blood or melena appearance to the stool. He denies any recent travel, consumption of uncooked food, consumption of untreated water, exposure to C. Difficile, or exposure to any other diarrhea patients. He has not been around any healthcare facilities and has not been on a antibiotic in a matter of months. Symptoms are moderate on daily basis with only mild improvement with Imodium AD. Denies shortness of breath, cough or rash   Review Of Systems Outlined In HPI  Past Medical History  Diagnosis Date  . Choledocholithiasis with obstruction 03/21/2013    December 2013: 90% resection of pancreas.     Past Surgical History  Procedure Laterality Date  . Gallbladder surgery  06/2012  . Vasectomy     Family History  Problem Relation Age of Onset  . Colon cancer Father 63    1988  . Hypertension Father   . Hyperlipidemia Father   . Lung cancer Paternal Grandmother     Social History   Social History  . Marital Status: Married    Spouse Name: N/A  . Number of Children: N/A  . Years of Education: N/A   Occupational History  . Not on file.   Social History Main Topics  . Smoking status: Never Smoker   . Smokeless tobacco: Not on file  . Alcohol Use: No  . Drug Use: No  . Sexual Activity: Yes   Other Topics Concern  . Not on file   Social History Narrative     Objective: BP 120/74 mmHg  Pulse 69  Wt 218 lb (98.884 kg)  General: Alert and Oriented, No Acute  Distress HEENT: Pupils equal, round, reactive to light. Conjunctivae clear.  Moistness membranes Lungs: Clear to auscultation bilaterally, no wheezing/ronchi/rales.  Comfortable work of breathing. Good air movement. Cardiac: Regular rate and rhythm. Normal S1/S2.  No murmurs, rubs, nor gallops.   Abdomen: Normal bowel sounds, soft and non tender without palpable masses. No rebound tenderness or guarding Extremities: No peripheral edema.  Strong peripheral pulses.  Mental Status: No depression, anxiety, nor agitation. Skin: Warm and dry.  Assessment & Plan: Rodney Wright was seen today for diarrhea.  Diagnoses and all orders for this visit:  Diarrhea, unspecified type -     COMPLETE METABOLIC PANEL WITH GFR -     CBC with Differential -     Lipase -     Stool culture -     diphenoxylate-atropine (LOMOTIL) 2.5-0.025 MG tablet; Take 1 tablet by mouth 4 (four) times daily as needed for diarrhea or loose stools.   Diarrhea: labs as above, very low suspicion for C. Difficile given lack of risk factors. Checking stool culture and start Lomotil. Still possible that this can be due to Norovirus which should be self resolving. Discussed ways of staying well hydrated.   Return if symptoms worsen or fail to improve.

## 2015-12-18 ENCOUNTER — Telehealth: Payer: Self-pay | Admitting: Family Medicine

## 2015-12-18 DIAGNOSIS — K863 Pseudocyst of pancreas: Secondary | ICD-10-CM

## 2015-12-18 DIAGNOSIS — R197 Diarrhea, unspecified: Secondary | ICD-10-CM

## 2015-12-18 LAB — COMPLETE METABOLIC PANEL WITH GFR
ALBUMIN: 4.3 g/dL (ref 3.6–5.1)
ALK PHOS: 80 U/L (ref 40–115)
ALT: 17 U/L (ref 9–46)
AST: 20 U/L (ref 10–35)
BUN: 22 mg/dL (ref 7–25)
CALCIUM: 8.8 mg/dL (ref 8.6–10.3)
CHLORIDE: 103 mmol/L (ref 98–110)
CO2: 26 mmol/L (ref 20–31)
Creat: 1 mg/dL (ref 0.70–1.33)
GFR, EST NON AFRICAN AMERICAN: 87 mL/min (ref >=60)
GFR, Est African American: >89 mL/min (ref >=60)
GLUCOSE: 84 mg/dL (ref 65–99)
POTASSIUM: 4.6 mmol/L (ref 3.5–5.3)
SODIUM: 137 mmol/L (ref 135–146)
Total Bilirubin: 0.6 mg/dL (ref 0.2–1.2)
Total Protein: 7.1 g/dL (ref 6.1–8.1)

## 2015-12-18 LAB — CBC WITH DIFFERENTIAL/PLATELET
BASOS PCT: 0 % (ref 0–1)
Basophils Absolute: 0 10*3/uL (ref 0.0–0.1)
Eosinophils Absolute: 0.1 10*3/uL (ref 0.0–0.7)
Eosinophils Relative: 1 % (ref 0–5)
HEMATOCRIT: 40.1 % (ref 39.0–52.0)
HEMOGLOBIN: 12.7 g/dL — AB (ref 13.0–17.0)
LYMPHS PCT: 7 % — AB (ref 12–46)
Lymphs Abs: 0.5 10*3/uL — ABNORMAL LOW (ref 0.7–4.0)
MCH: 24.9 pg — AB (ref 26.0–34.0)
MCHC: 31.7 g/dL (ref 30.0–36.0)
MCV: 78.6 fL (ref 78.0–100.0)
MPV: 10 fL (ref 8.6–12.4)
Monocytes Absolute: 0.6 10*3/uL (ref 0.1–1.0)
Monocytes Relative: 8 % (ref 3–12)
NEUTROS ABS: 6.6 10*3/uL (ref 1.7–7.7)
NEUTROS PCT: 84 % — AB (ref 43–77)
Platelets: 134 10*3/uL — ABNORMAL LOW (ref 150–400)
RBC: 5.1 MIL/uL (ref 4.22–5.81)
RDW: 13.8 % (ref 11.5–15.5)
WBC: 7.8 10*3/uL (ref 4.0–10.5)

## 2015-12-18 LAB — LIPASE: Lipase: 20 U/L (ref 7–60)

## 2015-12-18 NOTE — Telephone Encounter (Signed)
Pt.notified

## 2015-12-18 NOTE — Telephone Encounter (Signed)
Will you please let patient know that his Lipase, liver function, and white blood cell count was normal.  As we discussed during yesterdays office visit I'd recommend getting an abdominal ultrasound.  I've put in an order for this, can you please give him the phone number for our radiology office so he can try to schedule this today or tomorrow.

## 2015-12-18 NOTE — Telephone Encounter (Signed)
Awaiting call back.

## 2015-12-22 LAB — STOOL CULTURE

## 2015-12-23 ENCOUNTER — Ambulatory Visit (INDEPENDENT_AMBULATORY_CARE_PROVIDER_SITE_OTHER): Payer: 59

## 2015-12-23 DIAGNOSIS — K7689 Other specified diseases of liver: Secondary | ICD-10-CM

## 2015-12-23 DIAGNOSIS — K863 Pseudocyst of pancreas: Secondary | ICD-10-CM

## 2015-12-23 DIAGNOSIS — R197 Diarrhea, unspecified: Secondary | ICD-10-CM

## 2016-01-01 ENCOUNTER — Telehealth: Payer: Self-pay

## 2016-01-01 NOTE — Telephone Encounter (Signed)
I've run out of tests and I'm at a loss to find the source of his diarrhea. I'd recommend he reach out to [facility] for further evaluation. Please let me know if he needs a referral for some reason, since he already is established with them I don't think a referral is required.  (These are the same recommendations from last month)

## 2016-01-01 NOTE — Telephone Encounter (Signed)
Pt reports that he is still having problem with diarrhea and wants to know what next.

## 2016-01-01 NOTE — Telephone Encounter (Signed)
Pt advised.

## 2016-06-06 IMAGING — CT CT ABD-PELV W/ CM
2 of 5 series · 13 of 32 positions shown, 18 images · IV contrast (omnipaque)
Comparison: None.

CLINICAL DATA: Left-sided abdominal pain for 2 weeks with radiation
into the back, fevers, history of pancreatitis

EXAM:
CT ABDOMEN AND PELVIS WITH CONTRAST
TECHNIQUE: Multidetector CT imaging of the abdomen and pelvis was performed
using the standard protocol following bolus administration of
intravenous contrast.
CONTRAST:  100mL OMNIPAQUE IOHEXOL 300 MG/ML  SOLN

[Series 2: abd/pelvis with · axial · 0.71mm/px · z∈[-481,-136]mm · 6 of 97 slices shown, 11 images]
[im 14/97  soft-tissue]
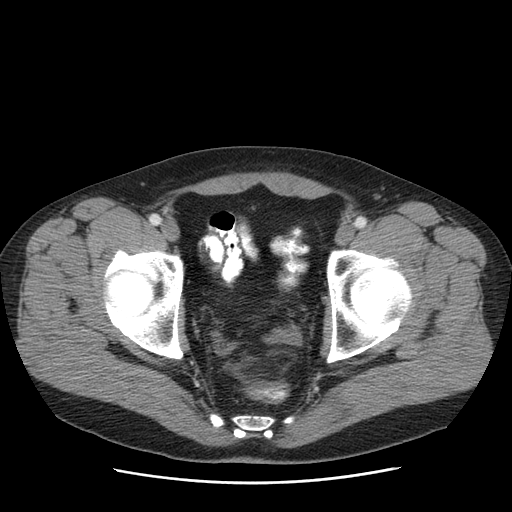
[im 14/97  bone]
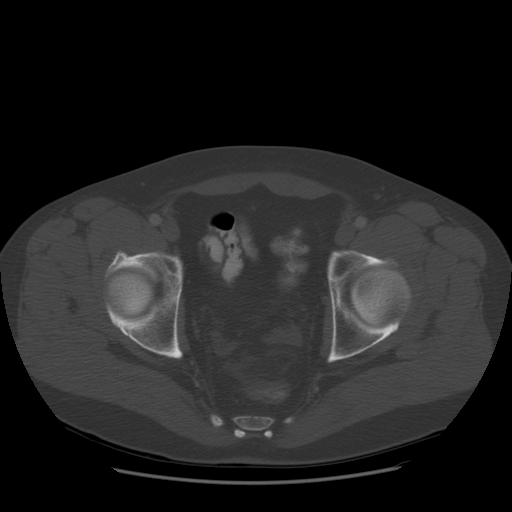
[im 28/97  soft-tissue]
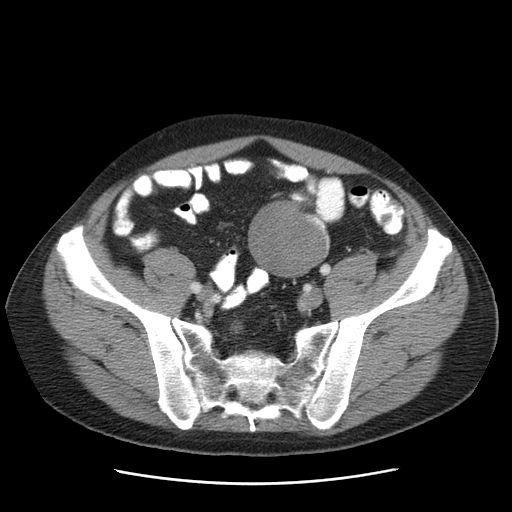
[im 42/97  soft-tissue]
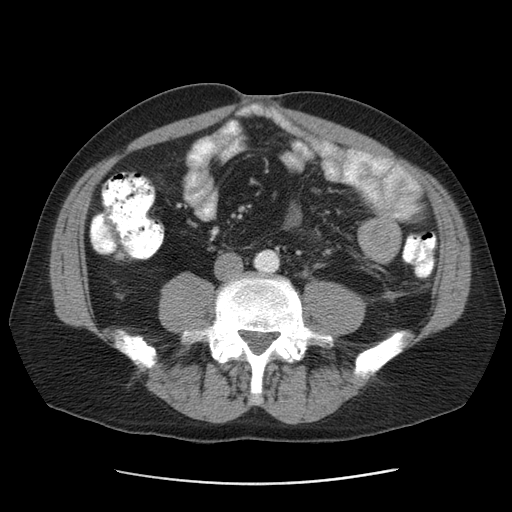
[im 42/97  lung]
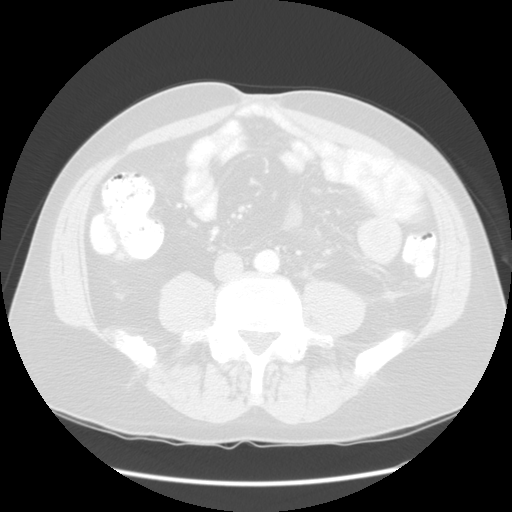
[im 55/97  soft-tissue]
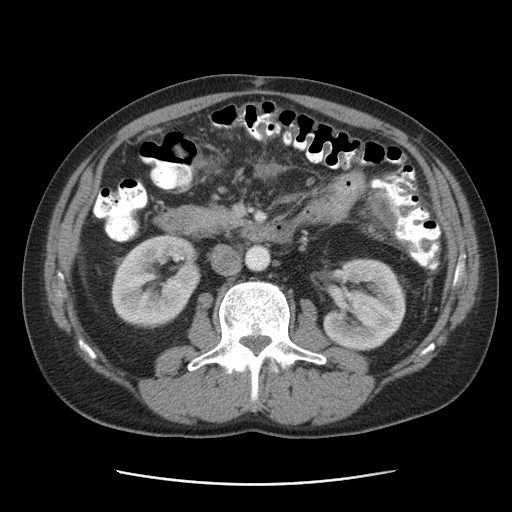
[im 55/97  lung]
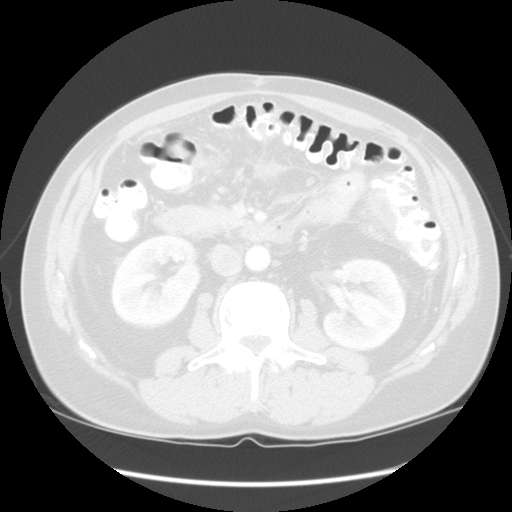
[im 69/97  soft-tissue]
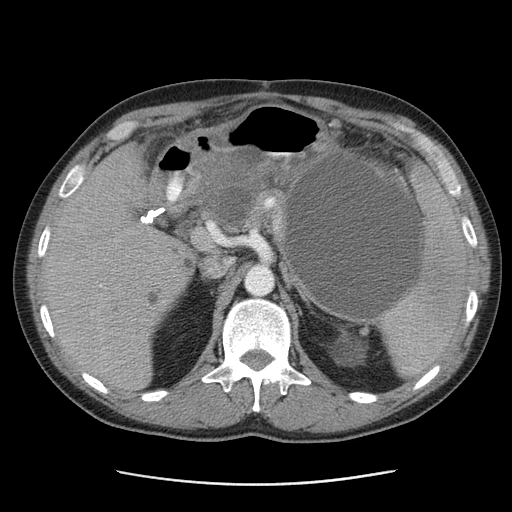
[im 69/97  lung]
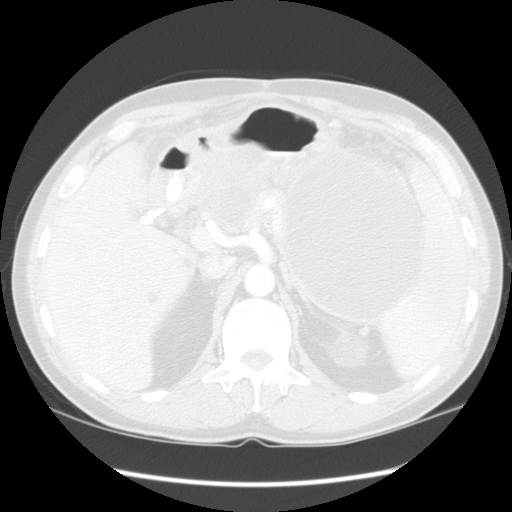
[im 83/97  soft-tissue]
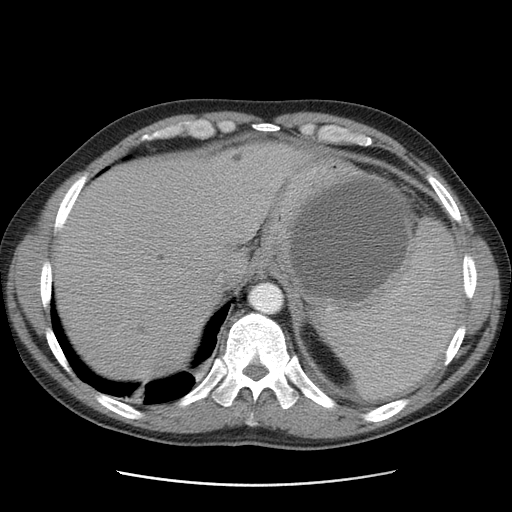
[im 83/97  lung]
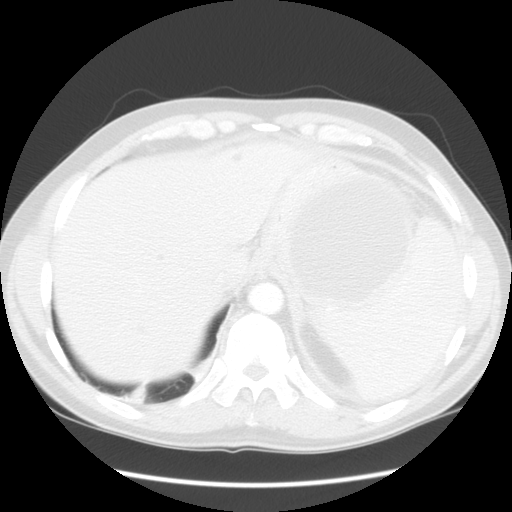

[Series 400: sag soft · sagittal · 0.97mm/px · 7 of 143 slices shown]
[im 15/143  soft-tissue]
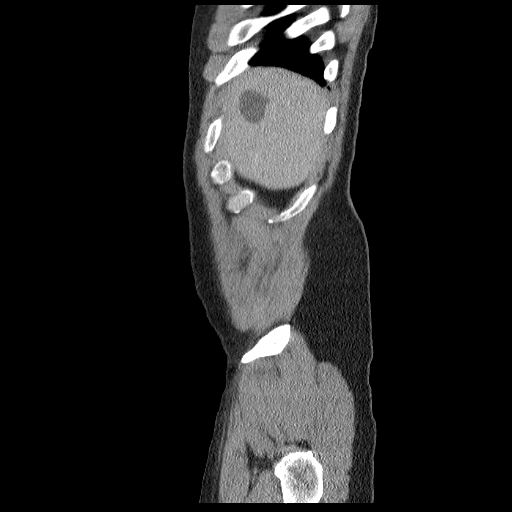
[im 29/143  soft-tissue]
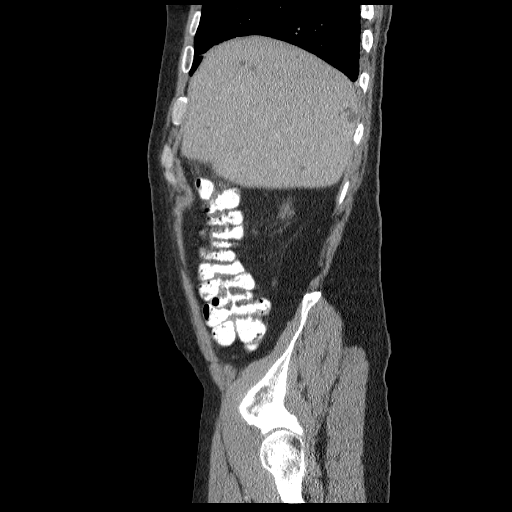
[im 43/143  soft-tissue]
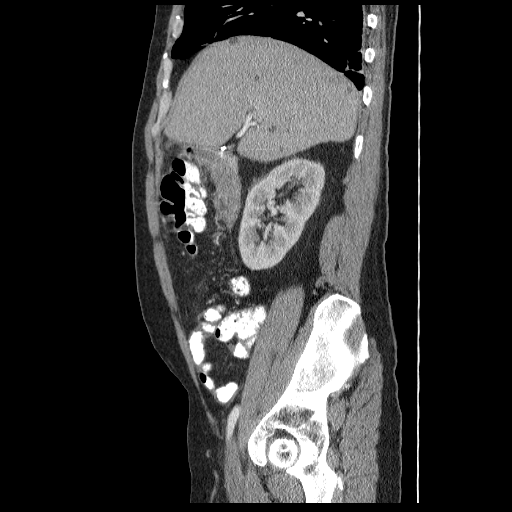
[im 57/143  soft-tissue]
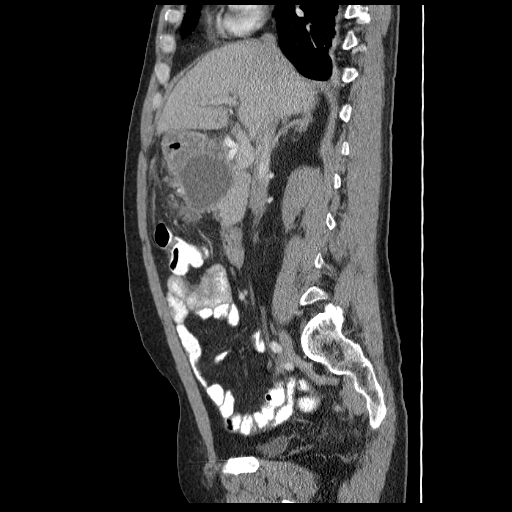
[im 86/143  soft-tissue]
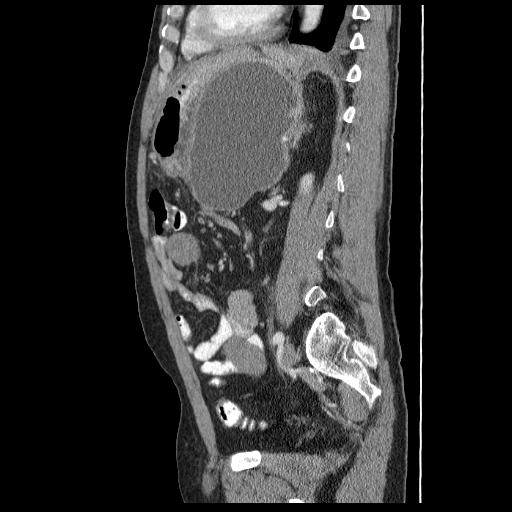
[im 100/143  soft-tissue]
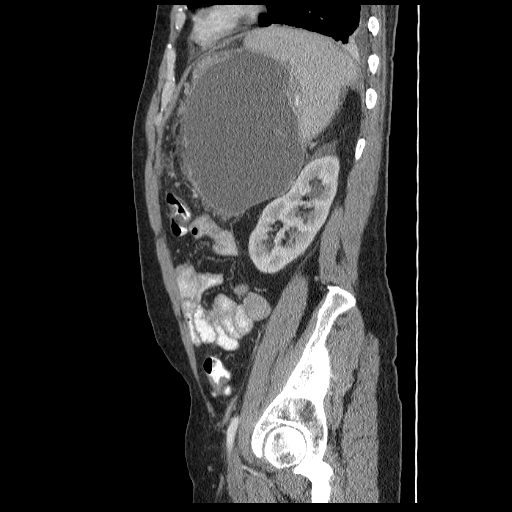
[im 114/143  soft-tissue]
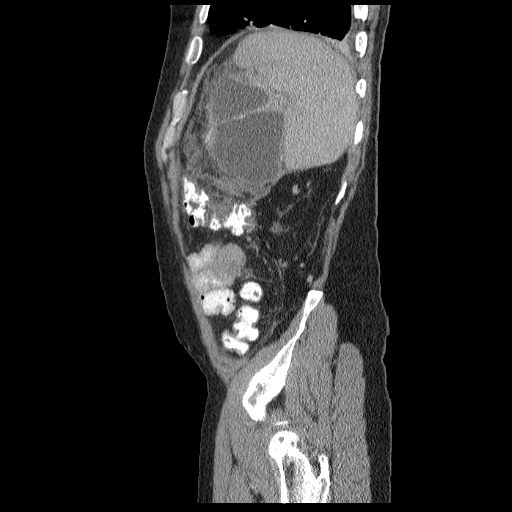

[13 of 32 positions shown; findings below may reference images not displayed]

FINDINGS: Lung bases demonstrate mild atelectatic changes bilaterally slightly
worse on the left than the right with a small left-sided pleural
effusion.

Multiple rounded hypodensities are noted throughout the liver. The
largest of these lies within the right lobe measuring 2.6 cm in
greatest dimension consistent with small cysts. The spleen is within
normal limits. The adrenal glands are unremarkable. The gallbladder
has been surgically removed. In the head and body of the pancreas is
a large bilobed fluid collection identified. It measures at least
17.7 cm in transverse dimension and 10.4 cm in AP dimension. This is
consistent with a large pancreatic pseudocyst. Some surrounding
inflammatory changes are noted suggesting an acute on chronic
process. The pancreatic head and uncinate process appear within
normal limits. No other normal pancreatic tissue is identified. A
minimal amount of fluid is noted within the lesser sac posterior to
the stomach. This is likely related to the inflammatory change in
the pancreas.

The kidneys are well visualized without renal calculi or obstructive
change. Delayed images demonstrate normal excretion of contrast.

The appendix is within normal limits. The bladder is partially
distended. A second rounded fluid collection is noted in the midline
of the mesenteric in the superior aspect of the pelvis. This likely
represents a second pseudocyst. This measures 5.8 x 5.3 cm in
greatest dimension. No free pelvic fluid is noted. The osseous
structures are within normal limits. No lymphadenopathy or pelvic
mass is seen.
IMPRESSION: Large pancreatic pseudocyst as described above with some surrounding
inflammatory changes suggesting an acute on chronic process. A
second pseudocyst is noted in the superior aspect of the pelvis.

Although not mentioned in the body of the report there is a soft
tissue density below the pancreas which measures 4.8 x 3.6 cm this
appears in part contiguous with the larger pseudocyst superiorly and
may represent some localized inflammatory change related to the
pancreatitis lower a evolving pseudocyst.

Small pleural effusion with compensatory atelectasis in the left
lung base.

No other acute abnormality is seen.

These results will be called to the ordering clinician or
representative by the Radiologist Assistant, and communication
documented in the PACS or zVision Dashboard.

## 2017-01-12 ENCOUNTER — Ambulatory Visit (INDEPENDENT_AMBULATORY_CARE_PROVIDER_SITE_OTHER): Payer: 59 | Admitting: Sports Medicine

## 2017-01-12 ENCOUNTER — Ambulatory Visit (INDEPENDENT_AMBULATORY_CARE_PROVIDER_SITE_OTHER): Payer: 59

## 2017-01-12 DIAGNOSIS — S99911A Unspecified injury of right ankle, initial encounter: Secondary | ICD-10-CM | POA: Diagnosis not present

## 2017-01-12 DIAGNOSIS — X501XXA Overexertion from prolonged static or awkward postures, initial encounter: Secondary | ICD-10-CM | POA: Diagnosis not present

## 2017-01-12 MED ORDER — IBUPROFEN 800 MG PO TABS: 800.0000 mg | ORAL_TABLET | Freq: Three times a day (TID) | ORAL | 2 refills | Status: AC | PRN

## 2017-01-12 MED ORDER — HYDROCODONE-ACETAMINOPHEN 5-325 MG PO TABS: 1.0000 | ORAL_TABLET | Freq: Three times a day (TID) | ORAL | 0 refills | Status: AC | PRN

## 2017-01-12 NOTE — Progress Notes (Signed)
   Subjective:    I'm seeing this patient as a consultation for:  Dr. Laren BoomSean Hommel  CC: Right ankle pain  HPI: This is a pleasant 51 year old male, he works as a Psychologist, occupationalcoach at [employer], yesterday he had a severe inversion injury with swelling, bruising, and pain over the lateral ankle as well as the dorsal midfoot. He's had great difficulty bearing any weight since then. Pain is moderate, persistent.  Past medical history:  Negative.  See flowsheet/record as well for more information.  Surgical history: Negative.  See flowsheet/record as well for more information.  Family history: Negative.  See flowsheet/record as well for more information.  Social history: Negative.  See flowsheet/record as well for more information.  Allergies, and medications have been entered into the medical record, reviewed, and no changes needed.   Review of Systems: No headache, visual changes, nausea, vomiting, diarrhea, constipation, dizziness, abdominal pain, skin rash, fevers, chills, night sweats, weight loss, swollen lymph nodes, body aches, joint swelling, muscle aches, chest pain, shortness of breath, mood changes, visual or auditory hallucinations.   Objective:   General: Well Developed, well nourished, and in no acute distress.  Neuro/Psych: Alert and oriented x3, extra-ocular muscles intact, able to move all 4 extremities, sensation grossly intact. Skin: Warm and dry, no rashes noted.  Respiratory: Not using accessory muscles, speaking in full sentences, trachea midline.  Cardiovascular: Pulses palpable, no extremity edema. Abdomen: Does not appear distended. Right Ankle: Could swelling with tenderness over the ATFL Range of motion is full in all directions. Strength is 5/5 in all directions. Stable lateral and medial ligaments; squeeze test and kleiger test unremarkable; Talar dome nontender; No pain at base of 5th MT; No tenderness over cuboid; No tenderness over N spot or  navicular prominence No tenderness on posterior aspects of lateral and medial malleolus No sign of peroneal tendon subluxations; Negative tarsal tunnel tinel's Able to walk 4 steps. Right Foot: Visible swelling with tenderness over the dorsal midfoot Range of motion is full in all directions. Strength is 5/5 in all directions. No hallux valgus. No pes cavus or pes planus. No abnormal callus noted. No pain over the navicular prominence, or base of fifth metatarsal. No tenderness to palpation of the calcaneal insertion of plantar fascia. No pain at the Achilles insertion. No pain over the calcaneal bursa. No pain of the retrocalcaneal bursa. No tenderness to palpation over the tarsals, metatarsals, or phalanges. No hallux rigidus or limitus. No tenderness palpation over interphalangeal joints. No pain with compression of the metatarsal heads. Neurovascularly intact distally.  Impression and Recommendations:   This case required medical decision making of moderate complexity.  Right ankle injury Inversion injury yesterday with pain at the base of the dorsal metatarsals at the midfoot and over the ATFL. Patient has a boot at work that he will wear as well as use crutches, we are going to get x-rays of his foot and his ankle, hydrocodone and ibuprofen. Return in 2 weeks.

## 2017-01-12 NOTE — Assessment & Plan Note (Signed)
Inversion injury yesterday with pain at the base of the dorsal metatarsals at the midfoot and over the ATFL. Patient has a boot at work that he will wear as well as use crutches, we are going to get x-rays of his foot and his ankle, hydrocodone and ibuprofen. Return in 2 weeks.

## 2017-12-19 ENCOUNTER — Ambulatory Visit: Payer: 59 | Admitting: Sports Medicine

## 2017-12-19 ENCOUNTER — Encounter: Payer: Self-pay | Admitting: Sports Medicine

## 2017-12-19 DIAGNOSIS — D649 Anemia, unspecified: Secondary | ICD-10-CM | POA: Diagnosis not present

## 2017-12-19 DIAGNOSIS — R42 Dizziness and giddiness: Secondary | ICD-10-CM | POA: Diagnosis not present

## 2017-12-19 LAB — POCT HEMOGLOBIN: Hemoglobin: 8.5 g/dL — AB (ref 14.1–18.1)

## 2017-12-19 NOTE — Assessment & Plan Note (Addendum)
Multiple possible etiologies. Hemoglobin is 8.5 today, adding iron indices. History of pancreatitis, necrotizing as well as pseudocyst formation. Also with a strong family history of colon cancer in his father diagnosed at an age younger than he is. CBC, CMP, lipase, amylase. I am going to set him up with digestive health. No chest symptoms, cough, no constitutional symptoms. Return to see me in a week.

## 2017-12-19 NOTE — Progress Notes (Signed)
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  On further questioning he has had mild shortness of breath, no chest pain, he has had significant melena over the past week which is overall resolving.  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  Symptoms are moderate, persistent.  No hematemesis.  Only minimal to no epigastric discomfort.  I reviewed the past medical history, family history, social history, surgical history, and allergies today and no changes were needed.  Please see the problem list section below in epic for further details.  Past Medical History: Past Medical History:  Diagnosis Date  . Choledocholithiasis with obstruction 03/21/2013   December 2013: 90% resection of pancreas.    Past Surgical History: Past Surgical History:  Procedure Laterality Date  . GALLBLADDER SURGERY  06/2012  . VASECTOMY     Social History: Social History   Socioeconomic History  . Marital status: Married    Spouse name: None  . Number of children: None  . Years of education: None  . Highest education level: None  Social Needs  . Financial resource strain: None  . Food insecurity - worry: None  . Food insecurity - inability: None  . Transportation needs - medical: None  . Transportation needs - non-medical: None  Occupational History  . None  Tobacco Use  . Smoking status: Never Smoker  . Smokeless tobacco: Never Used  Substance and Sexual Activity  . Alcohol use: No  . Drug use: No  . Sexual activity: Yes  Other Topics Concern  . None  Social History Narrative  . None   Family History: Family History  Problem Relation Age of Onset  . Colon cancer  Father [id number]       1988  . Hypertension Father   . Hyperlipidemia Father   . Lung cancer Paternal Grandmother    Allergies: No Known Allergies Medications: See med rec.  Review of Systems: No fevers, chills, night sweats, weight loss, chest pain, or shortness of breath.   Objective:    General: Well Developed, well nourished, and in no acute distress.  Neuro: Alert and oriented x3, extra-ocular muscles intact, sensation grossly intact.  HEENT: Normocephalic, atraumatic, pupils equal round reactive to light, neck supple, no masses, no lymphadenopathy, thyroid nonpalpable.  Skin: Warm and dry, no rashes. Cardiac: Regular rate and rhythm, no murmurs rubs or gallops, no lower extremity edema.  Respiratory: Clear to auscultation bilaterally. Not using accessory muscles, speaking in full sentences. Abdomen: Well-healed multiple surgical scars, nondistended, normal bowel sounds, no palpable masses, no guarding, rigidity, rebound tenderness.  Point-of-care hemoglobin obtained here in the office, it was low at 8.5.  Impression and Recommendations:    Anemia Multiple possible etiologies. Hemoglobin is 8.5 today, adding iron indices. History of pancreatitis, necrotizing as well as pseudocyst formation. Also with a strong family history of colon cancer in his father diagnosed at an age younger than he is. CBC, CMP, lipase, amylase. I am going to set him up with digestive health. No chest symptoms, cough, no constitutional symptoms. Return to see me in a week. ___________________________________________ Ihor Austinhomas J. Benjamin Stainhekkekandam, M.D., ABFM., CAQSM. Primary Care and Sports Medicine [facility]  Adjunct Instructor  of [employer]

## 2017-12-20 LAB — CBC WITH DIFFERENTIAL/PLATELET
Basophils Absolute: 22 cells/uL (ref 0–200)
Basophils Relative: 0.3 %
Eosinophils Absolute: 50 cells/uL (ref 15–500)
Eosinophils Relative: 0.7 %
HCT: 25.6 % — ABNORMAL LOW (ref 38.5–50.0)
Hemoglobin: 8.7 g/dL — ABNORMAL LOW (ref 13.2–17.1)
Lymphs Abs: 1015 {cells}/uL (ref 850–3900)
MCH: 29.3 pg (ref 27.0–33.0)
MCHC: 34 g/dL (ref 32.0–36.0)
MCV: 86.2 fL (ref 80.0–100.0)
MPV: 10.3 fL (ref 7.5–12.5)
Monocytes Relative: 6.2 %
Neutro Abs: 5666 {cells}/uL (ref 1500–7800)
Neutrophils Relative %: 78.7 %
Platelets: 201 10*3/uL (ref 140–400)
RBC: 2.97 Million/uL — ABNORMAL LOW (ref 4.20–5.80)
RDW: 13.5 % (ref 11.0–15.0)
Total Lymphocyte: 14.1 %
WBC mixed population: 446 {cells}/uL (ref 200–950)
WBC: 7.2 10*3/uL (ref 3.8–10.8)

## 2017-12-20 LAB — ANEMIA PROFILE B
%SAT: 9 % (calc) — ABNORMAL LOW (ref 15–60)
ABS Retic: 166880 cells/uL — ABNORMAL HIGH (ref 25000–9000)
Basophils Absolute: 21 {cells}/uL (ref 0–200)
Basophils Relative: 0.3 %
Eosinophils Absolute: 63 cells/uL (ref 15–500)
Eosinophils Relative: 0.9 %
Ferritin: 24 ng/mL (ref 20–380)
Folate: >24 ng/mL
HCT: 26 % — ABNORMAL LOW (ref 38.5–50.0)
Hemoglobin: 8.7 g/dL — ABNORMAL LOW (ref 13.2–17.1)
Iron: 34 ug/dL — ABNORMAL LOW (ref 50–180)
Lymphs Abs: 1008 cells/uL (ref 850–3900)
MCH: 28.9 pg (ref 27.0–33.0)
MCHC: 33.5 g/dL (ref 32.0–36.0)
MCV: 86.4 fL (ref 80.0–100.0)
MPV: 10.3 fL (ref 7.5–12.5)
Monocytes Relative: 6.3 %
Neutro Abs: 5467 cells/uL (ref 1500–7800)
Neutrophils Relative %: 78.1 %
Platelets: 200 Thousand/uL (ref 140–400)
RBC: 3.01 Million/uL — ABNORMAL LOW (ref 4.20–5.80)
RDW: 13.6 % (ref 11.0–15.0)
Retic Ct Pct: 5.6 %
TIBC: 385 mcg/dL (calc) (ref 250–425)
Total Lymphocyte: 14.4 %
Vitamin B-12: 444 pg/mL (ref 200–1100)
WBC mixed population: 441 {cells}/uL (ref 200–950)
WBC: 7 Thousand/uL (ref 3.8–10.8)

## 2017-12-20 LAB — COMPREHENSIVE METABOLIC PANEL WITH GFR
AG Ratio: 1.8 (calc) (ref 1.0–2.5)
Albumin: 3.9 g/dL (ref 3.6–5.1)
BUN: 20 mg/dL (ref 7–25)
CO2: 28 mmol/L (ref 20–32)
Calcium: 8.4 mg/dL — ABNORMAL LOW (ref 8.6–10.3)
Potassium: 4.1 mmol/L (ref 3.5–5.3)
Sodium: 140 mmol/L (ref 135–146)
Total Bilirubin: 0.3 mg/dL (ref 0.2–1.2)
Total Protein: 6.1 g/dL (ref 6.1–8.1)

## 2017-12-20 LAB — COMPREHENSIVE METABOLIC PANEL
ALT: 44 U/L (ref 9–46)
AST: 25 U/L (ref 10–35)
Alkaline phosphatase (APISO): 61 U/L (ref 40–115)
Chloride: 106 mmol/L (ref 98–110)
Creat: 0.87 mg/dL (ref 0.70–1.33)
Globulin: 2.2 g/dL (calc) (ref 1.9–3.7)
Glucose, Bld: 112 mg/dL — ABNORMAL HIGH (ref 65–99)

## 2017-12-20 LAB — APTT: aPTT: 23 s (ref 22–34)

## 2017-12-20 LAB — LIPASE: Lipase: 29 U/L (ref 7–60)

## 2017-12-20 LAB — AMYLASE: Amylase: 40 U/L (ref 21–101)

## 2017-12-20 LAB — PROTIME-INR
INR: 1
Prothrombin Time: 10.2 s (ref 9.0–11.5)

## 2017-12-25 MED ORDER — GENERIC EXTERNAL MEDICATION
Status: DC
Start: ? — End: 2017-12-25

## 2017-12-25 MED ORDER — NITROGLYCERIN 0.4 MG SL SUBL
0.40 | SUBLINGUAL_TABLET | SUBLINGUAL | Status: DC
Start: ? — End: 2017-12-25

## 2017-12-25 MED ORDER — SODIUM CHLORIDE 0.9 % IV SOLN
INTRAVENOUS | Status: DC
Start: ? — End: 2017-12-25

## 2017-12-25 MED ORDER — ACETAMINOPHEN 325 MG PO TABS
650.00 | ORAL_TABLET | ORAL | Status: DC
Start: ? — End: 2017-12-25

## 2017-12-25 MED ORDER — POLYETHYLENE GLYCOL 3350 17 G PO PACK
17.00 | PACK | ORAL | Status: DC
Start: ? — End: 2017-12-25

## 2017-12-25 MED ORDER — ANTACID & ANTIGAS 200-200-20 MG/5ML PO SUSP
30.00 | ORAL | Status: DC
Start: ? — End: 2017-12-25

## 2017-12-25 MED ORDER — HYDROCODONE-ACETAMINOPHEN 5-325 MG PO TABS
ORAL_TABLET | ORAL | Status: DC
Start: ? — End: 2017-12-25
